# Patient Record
Sex: Male | Born: 1975 | Race: White | Hispanic: No | Marital: Married | Smoking: Never smoker
Health system: Southern US, Community
[De-identification: ages and names within clinical notes are randomized; demographics above are authoritative.]

## PROBLEM LIST (undated history)

## (undated) DIAGNOSIS — E119 Type 2 diabetes mellitus without complications: Secondary | ICD-10-CM

---

## 2012-09-12 DIAGNOSIS — F32A Depression, unspecified: Secondary | ICD-10-CM | POA: Insufficient documentation

## 2015-05-15 DIAGNOSIS — Z8709 Personal history of other diseases of the respiratory system: Secondary | ICD-10-CM | POA: Insufficient documentation

## 2018-08-28 DIAGNOSIS — N401 Enlarged prostate with lower urinary tract symptoms: Secondary | ICD-10-CM | POA: Insufficient documentation

## 2019-05-20 ENCOUNTER — Ambulatory Visit (INDEPENDENT_AMBULATORY_CARE_PROVIDER_SITE_OTHER): Payer: 59 | Admitting: Adult Health

## 2019-05-20 ENCOUNTER — Encounter: Payer: Self-pay | Admitting: Adult Health

## 2019-05-20 ENCOUNTER — Other Ambulatory Visit: Payer: Self-pay

## 2019-05-20 VITALS — BP 149/101 | HR 111 | Ht 71.0 in | Wt 278.0 lb

## 2019-05-20 DIAGNOSIS — F331 Major depressive disorder, recurrent, moderate: Secondary | ICD-10-CM

## 2019-05-20 DIAGNOSIS — G4700 Insomnia, unspecified: Secondary | ICD-10-CM

## 2019-05-20 DIAGNOSIS — F411 Generalized anxiety disorder: Secondary | ICD-10-CM | POA: Diagnosis not present

## 2019-05-20 MED ORDER — FLUOXETINE HCL 20 MG PO CAPS: 20.0000 mg | ORAL_CAPSULE | Freq: Every day | ORAL | 5 refills | Status: DC

## 2019-05-20 MED ORDER — HYDROXYZINE HCL 25 MG PO TABS: ORAL_TABLET | ORAL | 5 refills | Status: DC
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
## 2019-05-24 ENCOUNTER — Ambulatory Visit: Payer: 59 | Admitting: Adult Health

## 2019-05-29 ENCOUNTER — Ambulatory Visit: Payer: Managed Care, Other (non HMO)

## 2019-06-17 ENCOUNTER — Ambulatory Visit: Payer: 59 | Admitting: Adult Health

## 2019-06-24 ENCOUNTER — Ambulatory Visit: Payer: 59 | Admitting: Adult Health

## 2019-11-19 ENCOUNTER — Other Ambulatory Visit: Payer: Self-pay | Admitting: Adult Health

## 2019-11-21 ENCOUNTER — Other Ambulatory Visit: Payer: Self-pay | Admitting: Adult Health

## 2019-11-21 NOTE — Telephone Encounter (Signed)
Last apt 02/11 due back 4 weeks

## 2020-05-13 ENCOUNTER — Emergency Department (HOSPITAL_BASED_OUTPATIENT_CLINIC_OR_DEPARTMENT_OTHER)
Admission: EM | Admit: 2020-05-13 | Discharge: 2020-05-13 | Disposition: A | Discharge status: Home / Self Care | Payer: Worker's Compensation | Attending: Emergency Medicine | Admitting: Emergency Medicine

## 2020-05-13 ENCOUNTER — Other Ambulatory Visit: Payer: Self-pay

## 2020-05-13 ENCOUNTER — Emergency Department (HOSPITAL_BASED_OUTPATIENT_CLINIC_OR_DEPARTMENT_OTHER): Payer: Worker's Compensation

## 2020-05-13 DIAGNOSIS — Y990 Civilian activity done for income or pay: Secondary | ICD-10-CM | POA: Diagnosis not present

## 2020-05-13 DIAGNOSIS — Z79899 Other long term (current) drug therapy: Secondary | ICD-10-CM | POA: Insufficient documentation

## 2020-05-13 DIAGNOSIS — J45909 Unspecified asthma, uncomplicated: Secondary | ICD-10-CM | POA: Insufficient documentation

## 2020-05-13 DIAGNOSIS — S62346A Nondisplaced fracture of base of fifth metacarpal bone, right hand, initial encounter for closed fracture: Secondary | ICD-10-CM | POA: Diagnosis not present

## 2020-05-13 DIAGNOSIS — S6991XA Unspecified injury of right wrist, hand and finger(s), initial encounter: Secondary | ICD-10-CM | POA: Diagnosis present

## 2020-05-13 NOTE — Discharge Instructions (Addendum)
Recommend following up with an orthopedic doctor Monday.  Call the office where he had been previously seen.  You should not bear weight in your right hand until you are further instructed by your orthopedist.  Maintain splint.  Keep dry.  Use sling as needed for comfort.  Take Tylenol and Motrin as needed for pain control.

## 2020-05-13 NOTE — ED Notes (Signed)
Pt. Did not want the Sling.
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
## 2020-05-13 NOTE — ED Triage Notes (Signed)
Pt stated That he hurt his wrist in the performance of his job.

## 2020-05-24 ENCOUNTER — Other Ambulatory Visit: Payer: Self-pay | Admitting: Adult Health

## 2021-02-08 ENCOUNTER — Other Ambulatory Visit: Payer: Self-pay | Admitting: Adult Health

## 2021-02-09 NOTE — Telephone Encounter (Signed)
Patient was seen once by you 05/20/19 with RTC in 4 weeks. All scheduled appt since then have been cancelled or no show. ? Refuse request?
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
## 2024-02-23 NOTE — Progress Notes (Signed)
 Patient presents for  Chief Complaint  Patient presents with  . Annual Exam   HPI   Patient Care Team: Eva Sharolyn Sniff, DO as PCP - General (Family Medicine) Eva Sharolyn Sniff, DO as PCP - Attributed  Immunizations:  Immunizations by Immunization Family     Influenza, Injectable, Quadrivalent, Preservative Free 03/27/2018 (48 y.o.)      TDAP VACCINE (BOOSTRIX,ADACEL) 7Y+ 07/25/2018 (48 y.o.)          Health Maintenance Status       Date Due Completion Dates   Comprehensive Annual Visit Never done ---   Diabetes: Retinopathy Screening Combo Never done ---   Diabetes: Foot Exam Never done ---   Colorectal Cancer Screening Never done ---   Pneumococcal Vaccine: Pediatrics (0 to 5 years) and At-Risk Patients (6-49 Years) (1 of 2 - PCV) 02/22/2025 (Originally 09/16/1994) ---   Depression Monitoring 08/22/2024 02/23/2024   Diabetes:  eGFR for Kidney Evaluation 02/05/2025 02/06/2024, 11/04/2023   Diabetes:  Quantitative uACR for Kidney Evaluation 02/05/2025 02/06/2024   Diabetes: Hemoglobin A1C 02/05/2025 02/06/2024, 02/06/2024   DTaP/Tdap/Td Vaccines (2 - Td or Tdap) 07/24/2028 07/25/2018   Adult RSV (50+ Years or Pregnancy) (1 - 1-dose 75+ series) 09/16/2049 ---       Last PSA: N/A  New problems/Concerns:  ---reports his blood sugars have been up/down. He reports on his Dexcom his sugar dropped the other night to 56.  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   Reflux Patient presents for GERD follow up.  Reflux symptoms are well controlled with OTC Prilosec. Denies abdominal pain, dysphagia, regurgitation.    Diabetes Presents in follow up of his diabetes.  Patient is currently taking Metformin 1,000 mg BID, Mounjaro 5 mg weekly.  Hypoglycemic episodes: No Patient has no new complaints of foot numbness or burning or loss of sensation. Pt reports no lesions on the feet currently.  Denies polyuria, polydipsia, polyphagia, changes in vision, N/T. Last  diabetic eye exam (2 years ago) Lab Results  Component Value Date   HGBA1C 5.7 (H) 02/06/2024   BPH Pt reports he doesn't have any further symptoms. This had resolved over 2 years ago.   Anxiety/Depression Treated without medication.  Pt reports his stress was bad back in 2013 when going through his divorce.  He is doing well and feels this has resolved.   OSA Moderate, gets good results with wearing cpap nightly.   Insomnia Sleeping well. Wearing the cpap nightly.   Morbid Obesity:  --Patient has been following healthy diet.  Wt Readings from Last 3 Encounters:  02/23/24 133 kg (294 lb)  12/29/23 (!) 138 kg (304 lb 9.6 oz)  10/16/23 (!) 143 kg (316 lb)     Allergies[1]  Current Medications[2]  The following portions of the patient's history were reviewed and updated as appropriate: allergies, current medications, past medical history, past surgical history, past social history, past family history, and problem list.    REVIEW OF SYSTEMS   CONSTITUTIONAL: Appetite good, no fevers, night sweats or weight loss. HEAD: No unusual headaches. EYES: No visual changes, no eye pain. ENT: No hearing difficulties, no ear pain. CV: No chest pain or shortness of breath. RESPIRATORY: No cough or wheezing. GI: No difficulty swallowing,change in bowel habits, blood in the stool or black tarry . GU: No dysuria, urgency or incontinence or change in urinary habits. MS: No joint pain/swelling or musculoskeletal deformities. SKIN: No rashes. NEURO: No MS changes, no motor weakness, no  sensory changes. ENDOCRINE: No polyuria/polydipsia, no heat intolerance. HEME/LYMPH: No easy bleeding/bruising or swollen nodes.  PHYSICAL EXAM  BP 116/76 (BP Location: Right arm, Patient Position: Sitting)   Pulse 91   Resp 16   Ht 1.803 m (5' 11)   Wt 133 kg (294 lb)   BMI 41.00 kg/m  Wt Readings from Last 3 Encounters:  02/23/24 133 kg (294 lb)  12/29/23 (!) 138 kg (304 lb 9.6 oz)  10/16/23  (!) 143 kg (316 lb)    The 10-year ASCVD risk score (Arnett DK, et al., 2019) is: 4.3%   Values used to calculate the score:     Age: 5 years     Clinically relevant sex: Male     Is Non-Hispanic African American: No     Diabetic: Yes     Tobacco smoker: No     Systolic Blood Pressure: 116 mmHg     Is BP treated: No     HDL Cholesterol: 39 mg/dL     Total Cholesterol: 150 mg/dL  GENERAL: 48 y.o. year y/o male, well developed, well nourished in no acute distress. HEENT:  PERRLA, Extra ocular movements intact, Tympanic membranes clear bilaterally, oropharynx exam normal.  NECK:  thyroid without obvious nodules or enlargement. No lymphadenopathy.  No carotid bruits.   LUNGS: Respirations unlabored. Clear to auscultation bilaterally. HEART: Regular rate without murmurs, rubs, or gallops. ABDOMEN: Normal bowel sounds. Soft, non-tender, no hepatosplenomegaly, no palpable masses or lesions. EXTREMITIES: Peripheral pulses 2+ bilaterally, no edema. SKIN: No rashes, no evidence of skin breakdown, no suspicious nevi. NEURO: Cranial nerves II-XII intact.  Reflexes 2+ and symmetric. No obvious deficits. MENTAL STATUS: Alert, normal mental status. Answers all questions appropriately.  Diabetic Foot Exam Performed: Yes Diabetic Foot Exam Result:  [x]  Normal The patient's foot has been visually inspected with no ulcers, deformities, or abnormal toenails.  The patient can see the bottom of his/her feet and shoes fit properly. The patient can feel the 10g nylon filament on both feet for the 1st, 3rd, 5th metatarsals and big toes.  The patient's pedal pulses are palpable on both feet at dorsalis pedis and/or posterior tibial.  (If normal selected no need to complete below this line.) []  Abnormal  []  Abnormal with neuropathy []  Bilateral amputation lower extremities   Patient was offered the option to change into a gown for exam today and declined to change into gown/sheet combination for exam.  .  Results for orders placed or performed in visit on 02/06/24  Hemoglobin A1C With Estimated Average Glucose   Collection Time: 02/06/24 11:44 AM  Result Value Ref Range   Hemoglobin A1c 5.7 (H) <5.7 %   Estimated Average Glucose 117 mg/dL  Comprehensive Metabolic Panel   Collection Time: 02/06/24 11:44 AM  Result Value Ref Range   Sodium 137 136 - 145 mmol/L   Potassium 4.6 3.5 - 5.1 mmol/L   Chloride 104 98 - 107 mmol/L   CO2 23 21 - 31 mmol/L   Anion Gap 10 6 - 14 mmol/L   Glucose, Random 83 70 - 99 mg/dL   Blood Urea Nitrogen (BUN) 10 7 - 25 mg/dL   Creatinine 9.04 9.29 - 1.30 mg/dL   eGFR >09 >40 fO/fpw/8.26f7   Albumin 4.4 3.5 - 5.7 g/dL   Total Protein 7.4 6.4 - 8.9 g/dL   Bilirubin, Total 0.5 0.3 - 1.0 mg/dL   Alkaline Phosphatase (ALP) 73 34 - 104 U/L   Aspartate Aminotransferase (AST) 22 13 - 39  U/L   Alanine Aminotransferase (ALT) 35 7 - 52 U/L   Calcium 9.1 8.6 - 10.3 mg/dL   BUN/Creatinine Ratio    Lipid Panel   Collection Time: 02/06/24 11:44 AM  Result Value Ref Range   Cholesterol, Total, Lipid Panel 150 <200 mg/dL   Triglycerides, Lipid Panel 123 <150 mg/dL   HDL Cholesterol - Lipid Panel 39 (L) >40 mg/dL   LDL Cholesterol, Calculated 89 <100 mg/dL   Non-HDL Cholesterol 888 <130 mg/dL  TSH   Collection Time: 02/06/24 11:44 AM  Result Value Ref Range   TSH 1.208 0.450 - 5.330 uIU/mL  Albumin, Random Urine   Collection Time: 02/06/24 11:44 AM  Result Value Ref Range   Albumin, Urine <7 Not Established mg/L   Creatinine, Urine 203 >=20 mg/dL   Albumin/Creatinine Ratio, Urine    CBC with Differential   Collection Time: 02/06/24 11:44 AM  Result Value Ref Range   WBC 10.30 4.40 - 11.00 10*3/uL   RBC 5.31 4.50 - 5.90 10*6/uL   Hemoglobin 15.3 14.0 - 17.5 g/dL   Hematocrit 54.5 58.4 - 50.4 %   Mean Corpuscular Volume (MCV) 85.5 80.0 - 96.0 fL   Mean Corpuscular Hemoglobin (MCH) 28.7 27.5 - 33.2 pg   Mean Corpuscular Hemoglobin Conc (MCHC) 33.6 33.0 -  37.0 g/dL   Red Cell Distribution Width (RDW) 13.9 12.3 - 17.0 %   Platelet Count (PLT) 291 150 - 450 10*3/uL   Mean Platelet Volume (MPV) 8.5 6.8 - 10.2 fL   Neutrophils % 59 %   Lymphocytes % 23 %   Monocytes % 10 %   Eosinophils % 7 %   Basophils % 1 %   nRBC % 0 %   Neutrophils Absolute 6.10 1.80 - 7.80 10*3/uL   Lymphocytes # 2.40 1.00 - 4.80 10*3/uL   Monocytes # 1.00 (H) 0.00 - 0.80 10*3/uL   Eosinophils # 0.80 (H) 0.00 - 0.50 10*3/uL   Basophils # 0.10 0.00 - 0.20 10*3/uL   nRBC Absolute 0.00 <=0.00 10*3/uL    ASSESSMENT/PLAN   Routine wellness issues discussed including early cancer detection issues and cardiovascular risk reduction.  Also see HPI for additional details regarding recent wellness tests..  Routine advice given regarding diet, exercise, sleep, and weight reduction, if applicable. Immunizations reviewed and discussed.  --Labs reviewed.  1. Wellness examination (Primary) Pt is UTD on most measures. Colonoscopy ordered.   2. Gastroesophageal reflux disease without esophagitis Good control with current therapy OTC Prilosec. Avoid trigger foods and eating late. Remain upright after eating, limit caffeine. Report any new or worsening symptoms.  3. Type 2 diabetes mellitus treated without insulin    (CMD) A1C 5.7 down from 6.0. He is having some low sugars at times and also doing so well with his diet and exercise I will decrease his Metformin XR to 500 mg 1 daily. I would like him to call me if he continues to have some low sugars. Continue with Mounjaro weekly. Suggest Mediterranean, low carb & starch diet. Inspect feet daily. Yearly eye exam recommended. - metFORMIN (GLUCOPHAGE-XR) 500 mg 24 hr tablet; Take 1 tablet (500 mg total) by mouth daily with breakfast.  Dispense: 90 tablet; Refill: 3  4. Moderate obstructive sleep apnea Well controlled with wearing cpap nightly. Reports he does have some issues when turning over mask will come off, but he mostly gets  good relief.   5. Morbid obesity with body mass index (BMI) of 40.0 to 44.9 in adult (CMD) He  is doing well with weight loss with taking Mounjaro weekly. Encouraged patient to continue working hard on lifestyle changes, healthy eating which includes Mediterranean diet and daily exercise as tolerated to improve overall health.   6. Insomnia, unspecified type Resolved, no issues since OSA treatment.   7. Anxiety and depression Resolved several years ago when divorce was final.   8. Benign prostatic hyperplasia, unspecified whether lower urinary tract symptoms present Resolved, pt is not had any issues and advised this was a mild episode at one time.   9. Colon cancer screening - Endo Colon; Future    Return in about 6 months (around 08/22/2024) for DM, fasting lab prior A1C, BMP.  This document serves as a record of services personally performed by Eva Sniff, DO.  It was created on their behalf by Barnie Velia Molt, CMA, a trained medical scribe, and Certified Medical Assistant (CMA). During the course of documenting the history, physical exam and medical decision making, I was functioning as a stage manager. The creation of this record is the provider's dictation and/or activities during the visit.  Electronically signed by Barnie Velia Molt, CMA 02/23/2024 3:25 PM    I agree the documentation is accurate and complete.  Electronically signed by: Eva Sharolyn Sniff, DO 02/23/2024 3:04 PM         [1] Allergies Allergen Reactions  . Peanut Anaphylaxis  . Clindamycin Itching and Rash  [2]  Current Outpatient Medications:  .  clobetasoL (TEMOVATE) 0.05 % ointment, APPLY TOPICALLY TO THE AFFECTED AREA TWICE DAILY, Disp: 60 g, Rfl: 3 .  Nystop 100,000 unit/gram powder, APPLY THREE TIMES DAILY, Disp: 60 g, Rfl: 3 .  albuterol HFA (PROVENTIL HFA;VENTOLIN HFA;PROAIR HFA) 90 mcg/actuation inhaler, Inhale 2 puffs every 4 (four) hours as needed for wheezing., Disp: 6.7 g, Rfl:  5 .  blood-glucose meter,receiver,continuous (Dexcom G7 Receiver), 1 Units by miscellaneous route every 30 (thirty) days., Disp: 1 each, Rfl: 5 .  blood-glucose sensor (Dexcom G7 Sensor), 1 each by miscellaneous route every 10 days., Disp: 3 each, Rfl: 11 .  clobetasoL (TEMOVATE) 0.05 % scalp solution, Apply  topically Once Daily., Disp: 50 mL, Rfl: 5 .  EPINEPHrine (EPIPEN) 0.3 mg/0.3 mL injection syringe, Inject 0.3 mg into the muscle once as needed for anaphylaxis., Disp: 2 each, Rfl: 0 .  metFORMIN (GLUCOPHAGE-XR) 500 mg 24 hr tablet, Take 1 tablet (500 mg total) by mouth daily with breakfast., Disp: 90 tablet, Rfl: 3 .  Mounjaro 5 mg/0.5 mL subcutaneous pen injector, Inject 5 mg under the skin every 7 days. BRAND NAME ONLY, Disp: 2 mL, Rfl: 2 .  nebulizer accessories misc, Please supply 1 box of 50 nebulizer tubing and masks along with nebulizer machine., Disp: 1 each, Rfl: 3

## 2024-02-26 ENCOUNTER — Other Ambulatory Visit: Payer: Self-pay

## 2024-02-26 ENCOUNTER — Emergency Department (HOSPITAL_BASED_OUTPATIENT_CLINIC_OR_DEPARTMENT_OTHER)

## 2024-02-26 ENCOUNTER — Encounter (HOSPITAL_BASED_OUTPATIENT_CLINIC_OR_DEPARTMENT_OTHER): Payer: Self-pay | Admitting: Urology

## 2024-02-26 ENCOUNTER — Emergency Department (HOSPITAL_BASED_OUTPATIENT_CLINIC_OR_DEPARTMENT_OTHER)
Admission: EM | Admit: 2024-02-26 | Discharge: 2024-02-26 | Discharge status: Against Medical Advice | Attending: Emergency Medicine | Admitting: Emergency Medicine

## 2024-02-26 DIAGNOSIS — R197 Diarrhea, unspecified: Secondary | ICD-10-CM | POA: Insufficient documentation

## 2024-02-26 DIAGNOSIS — D72829 Elevated white blood cell count, unspecified: Secondary | ICD-10-CM | POA: Insufficient documentation

## 2024-02-26 DIAGNOSIS — Z91010 Allergy to peanuts: Secondary | ICD-10-CM | POA: Diagnosis not present

## 2024-02-26 DIAGNOSIS — J45909 Unspecified asthma, uncomplicated: Secondary | ICD-10-CM | POA: Diagnosis not present

## 2024-02-26 DIAGNOSIS — Z5329 Procedure and treatment not carried out because of patient's decision for other reasons: Secondary | ICD-10-CM | POA: Insufficient documentation

## 2024-02-26 DIAGNOSIS — Z7984 Long term (current) use of oral hypoglycemic drugs: Secondary | ICD-10-CM | POA: Diagnosis not present

## 2024-02-26 DIAGNOSIS — E11649 Type 2 diabetes mellitus with hypoglycemia without coma: Secondary | ICD-10-CM | POA: Diagnosis not present

## 2024-02-26 DIAGNOSIS — R112 Nausea with vomiting, unspecified: Secondary | ICD-10-CM | POA: Diagnosis present

## 2024-02-26 DIAGNOSIS — R1013 Epigastric pain: Secondary | ICD-10-CM | POA: Diagnosis not present

## 2024-02-26 DIAGNOSIS — R000 Tachycardia, unspecified: Secondary | ICD-10-CM

## 2024-02-26 DIAGNOSIS — Z7951 Long term (current) use of inhaled steroids: Secondary | ICD-10-CM | POA: Insufficient documentation

## 2024-02-26 DIAGNOSIS — Z794 Long term (current) use of insulin: Secondary | ICD-10-CM | POA: Insufficient documentation

## 2024-02-26 DIAGNOSIS — R6510 Systemic inflammatory response syndrome (SIRS) of non-infectious origin without acute organ dysfunction: Secondary | ICD-10-CM

## 2024-02-26 DIAGNOSIS — K219 Gastro-esophageal reflux disease without esophagitis: Secondary | ICD-10-CM | POA: Insufficient documentation

## 2024-02-26 DIAGNOSIS — R109 Unspecified abdominal pain: Secondary | ICD-10-CM | POA: Diagnosis present

## 2024-02-26 HISTORY — DX: Type 2 diabetes mellitus without complications: E11.9

## 2024-02-26 LAB — COMPREHENSIVE METABOLIC PANEL WITH GFR
ALT: 38 U/L (ref 0–44)
AST: 25 U/L (ref 15–41)
Albumin: 4.6 g/dL (ref 3.5–5.0)
Alkaline Phosphatase: 78 U/L (ref 38–126)
Anion gap: 15 (ref 5–15)
BUN: 13 mg/dL (ref 6–20)
CO2: 24 mmol/L (ref 22–32)
Calcium: 9.9 mg/dL (ref 8.9–10.3)
Chloride: 101 mmol/L (ref 98–111)
Creatinine, Ser: 1.13 mg/dL (ref 0.61–1.24)
GFR, Estimated: >60 mL/min (ref >60)
Glucose, Bld: 125 mg/dL — ABNORMAL HIGH (ref 70–99)
Potassium: 4 mmol/L (ref 3.5–5.1)
Sodium: 139 mmol/L (ref 135–145)
Total Bilirubin: 0.6 mg/dL (ref 0.0–1.2)
Total Protein: 8.4 g/dL — ABNORMAL HIGH (ref 6.5–8.1)

## 2024-02-26 LAB — CBC
HCT: 50.8 % (ref 39.0–52.0)
Hemoglobin: 16.7 g/dL (ref 13.0–17.0)
MCH: 28.3 pg (ref 26.0–34.0)
MCHC: 32.9 g/dL (ref 30.0–36.0)
MCV: 86.1 fL (ref 80.0–100.0)
Platelets: 352 K/uL (ref 150–400)
RBC: 5.9 MIL/uL — ABNORMAL HIGH (ref 4.22–5.81)
RDW: 13.3 % (ref 11.5–15.5)
WBC: 17.2 K/uL — ABNORMAL HIGH (ref 4.0–10.5)
nRBC: 0 % (ref 0.0–0.2)

## 2024-02-26 LAB — URINALYSIS, ROUTINE W REFLEX MICROSCOPIC
Glucose, UA: NEGATIVE mg/dL
Hgb urine dipstick: NEGATIVE
Ketones, ur: 15 mg/dL — AB
Leukocytes,Ua: NEGATIVE
Nitrite: NEGATIVE
Protein, ur: 30 mg/dL — AB
Specific Gravity, Urine: 1.02 (ref 1.005–1.030)
pH: 5.5 (ref 5.0–8.0)

## 2024-02-26 LAB — LACTIC ACID, PLASMA
Lactic Acid, Venous: 2.2 mmol/L (ref 0.5–1.9)
Lactic Acid, Venous: 1.7 mmol/L (ref 0.5–1.9)

## 2024-02-26 LAB — CBG MONITORING, ED: Glucose-Capillary: 155 mg/dL — ABNORMAL HIGH (ref 70–99)

## 2024-02-26 LAB — TSH: TSH: 1.8 u[IU]/mL (ref 0.350–4.500)

## 2024-02-26 LAB — URINALYSIS, MICROSCOPIC (REFLEX)

## 2024-02-26 LAB — T4, FREE: Free T4: 0.97 ng/dL (ref 0.61–1.12)

## 2024-02-26 LAB — MAGNESIUM: Magnesium: 1.8 mg/dL (ref 1.7–2.4)

## 2024-02-26 LAB — LIPASE, BLOOD: Lipase: 23 U/L (ref 11–51)

## 2024-02-26 LAB — TROPONIN T, HIGH SENSITIVITY
Troponin T High Sensitivity: <15 ng/L (ref 0–19)
Troponin T High Sensitivity: <15 ng/L (ref 0–19)

## 2024-02-26 MED ORDER — PANTOPRAZOLE SODIUM 40 MG IV SOLR
40.0000 mg | Freq: Once | INTRAVENOUS | Status: AC
  Administered 2024-02-26: 40 mg via INTRAVENOUS
  Filled 2024-02-26: qty 10

## 2024-02-26 MED ORDER — LACTATED RINGERS IV BOLUS
1000.0000 mL | Freq: Once | INTRAVENOUS | Status: AC
  Administered 2024-02-26: 1000 mL via INTRAVENOUS

## 2024-02-26 MED ORDER — MORPHINE SULFATE (PF) 4 MG/ML IV SOLN
4.0000 mg | Freq: Once | INTRAVENOUS | Status: AC
  Administered 2024-02-26: 4 mg via INTRAVENOUS
  Filled 2024-02-26: qty 1

## 2024-02-26 MED ORDER — ONDANSETRON HCL 4 MG/2ML IJ SOLN
4.0000 mg | Freq: Once | INTRAMUSCULAR | Status: AC
  Administered 2024-02-26: 4 mg via INTRAVENOUS
  Filled 2024-02-26: qty 2

## 2024-02-26 MED ORDER — IOHEXOL 350 MG/ML SOLN
100.0000 mL | Freq: Once | INTRAVENOUS | Status: AC | PRN
  Administered 2024-02-26: 100 mL via INTRAVENOUS

## 2024-02-26 MED ORDER — DICYCLOMINE HCL 10 MG/ML IM SOLN
20.0000 mg | Freq: Once | INTRAMUSCULAR | Status: AC
  Administered 2024-02-26: 20 mg via INTRAMUSCULAR
  Filled 2024-02-26: qty 2

## 2024-02-26 MED ORDER — HYDROMORPHONE HCL 1 MG/ML IJ SOLN
1.0000 mg | Freq: Once | INTRAMUSCULAR | Status: AC
  Administered 2024-02-26: 1 mg via INTRAVENOUS
  Filled 2024-02-26: qty 1

## 2024-02-26 MED ORDER — LACTATED RINGERS IV BOLUS
250.0000 mL | Freq: Once | INTRAVENOUS | Status: AC
  Administered 2024-02-26: 250 mL via INTRAVENOUS

## 2024-02-26 MED ORDER — PIPERACILLIN-TAZOBACTAM 3.375 G IVPB: 3.3750 g | Freq: Three times a day (TID) | INTRAVENOUS | Status: DC

## 2024-02-26 MED ORDER — HALOPERIDOL LACTATE 5 MG/ML IJ SOLN
4.0000 mg | Freq: Once | INTRAMUSCULAR | Status: AC
  Administered 2024-02-26: 4 mg via INTRAVENOUS
  Filled 2024-02-26: qty 1

## 2024-02-26 MED ORDER — ONDANSETRON 4 MG PO TBDP: 4.0000 mg | ORAL_TABLET | Freq: Three times a day (TID) | ORAL | 0 refills | Status: AC | PRN

## 2024-02-26 MED ORDER — PIPERACILLIN-TAZOBACTAM 3.375 G IVPB 30 MIN
3.3750 g | Freq: Once | INTRAVENOUS | Status: AC
  Administered 2024-02-26: 3.375 g via INTRAVENOUS
  Filled 2024-02-26: qty 50

## 2024-02-26 MED ORDER — PROMETHAZINE HCL 25 MG/ML IJ SOLN
INTRAMUSCULAR | Status: DC
Start: 2024-02-26 — End: 2024-02-26
  Filled 2024-02-26: qty 1

## 2024-02-26 MED ORDER — SODIUM CHLORIDE 0.9 % IV SOLN
12.5000 mg | Freq: Once | INTRAVENOUS | Status: DC
  Filled 2024-02-26: qty 0.5

## 2024-02-26 MED ORDER — PROMETHAZINE HCL 25 MG/ML IJ SOLN
INTRAMUSCULAR | Status: AC
  Administered 2024-02-26: 12.5 mg
  Filled 2024-02-26: qty 1

## 2024-02-26 MED ORDER — IOHEXOL 300 MG/ML  SOLN: 100.0000 mL | Freq: Once | INTRAMUSCULAR | Status: DC | PRN

## 2024-02-26 NOTE — Discharge Instructions (Addendum)
 It was a pleasure caring for you today in the emergency department. You are leaving against medical advice as we recommend admission to the hospital for continued care  As we discussed you are welcome to come back to the ED if your symptoms worsen and you can utilize the Zofran as needed for nausea management.  Please return to the emergency department for any worsening or worrisome symptoms.

## 2024-02-26 NOTE — ED Provider Notes (Addendum)
 [place] [facility] Provider Note  CSN: [id number] Arrival date & time: 02/26/24 9273  Chief Complaint(s) Emesis and Hypoglycemia  HPI Bruce Lewis is a 48 y.o. male with past medical history as below, significant for T2DM, MDD, OSA, BPH, GERD who presents to the ED with complaint of low blood sugar, vomiting and diarrhea.  Reports his symptoms began suddenly around 2:30 AM when he started having nonbloody vomit of around 6 episodes.  Reports he also began having diarrhea at about the same volume at that time.  Wears Dexcom states his blood sugar got down to the 70s and he felt shaky and sweaty.  Reports he tried to eat some crackers and ginger ale but vomited all up and his shaking worsened therefore he came to the ED.  Denies fever.  Denies sick contacts, new foods or recent antibiotic use.  Currently treated with Mounjaro 5 mg weekly and metformin 500 mg daily (first dose yesterday), recently decreased from metformin 1000 mg twice daily due to hypoglycemic events.  Down 25+ pounds in the past 5 months.   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 Authorizing Provider  metFORMIN (GLUCOPHAGE-XR) 500 MG 24 hr tablet Take 500 mg by mouth daily with breakfast. 02/23/24 02/22/25 Yes [provider]  albuterol (VENTOLIN HFA) 108 (90 Base) MCG/ACT inhaler Inhale into the lungs. 07/02/18   [provider]  amoxicillin (AMOXIL) 875 MG tablet Take 875 mg by mouth  2 (two) times daily. 05/19/19   [provider]  FLUoxetine  (PROZAC ) 20 MG capsule TAKE 1 CAPSULE(20 MG) BY MOUTH DAILY 02/09/21   Mozingo, Regina Nattalie, NP  hydrOXYzine  (ATARAX /VISTARIL ) 25 MG tablet TAKE 1/2 TO 1 TABLET BY MOUTH TWICE DAILY DURING THE DAY FOR ANXIETY AND 1 TO 2 TABLETS AT BEDTIME AS NEEDED FOR SLEEP 11/22/19   Mozingo, Regina Nattalie, NP  MOUNJARO 5 MG/0.5ML Pen SMARTSIG:5 Milligram(s) SUB-Q Once a Week    [provider]  omeprazole (PRILOSEC) 40 MG capsule TAKE ONE CAPSULE BY MOUTH TWICE DAILY 10/15/16   [provider]  predniSONE (DELTASONE) 10 MG tablet  05/19/19   [provider]  triamcinolone cream (KENALOG) 0.1 % Apply topically to rash, twice daily as needed 12/03/18   [provider]  Past Surgical History History reviewed. No pertinent surgical history. Family History History reviewed. No pertinent family history.  Social History Social History   Tobacco Use   Smoking status: Never   Smokeless tobacco: Never  Substance Use Topics   Alcohol use: Never   Drug use: Never   Allergies Peanut-containing drug products and Clindamycin/lincomycin  Review of Systems A thorough review of systems was obtained and all systems are negative except as noted in the HPI and PMH.   Physical Exam Vital Signs  I have reviewed the triage vital signs BP 109/78   Pulse (!) 123   Temp 98.5 F (36.9 C) (Oral)   Resp 18   Ht 6' (1.829 m)   Wt 122.5 kg   SpO2 94%   BMI 36.63 kg/m  Physical Exam General: Well-appearing. Alert. NAD HEENT: Normocephalic. White sclera. No rhinorrhea or congestion CV: Tachycardia.  Regular rhythm.  No murmur. Pulm: CTAB. Normal WOB on RA. No wheezing Abdomen: Soft, non-distended. +BS.  Mild tenderness to palpation in RUQ.  No rebound tenderness or guarding. Ext: Well  perfused. Cap refill < 3 seconds Skin: Warm, dry. No rashes noted   ED Results and Treatments Labs (all labs ordered are listed, but only abnormal results are displayed) Labs Reviewed  COMPREHENSIVE METABOLIC PANEL WITH GFR - Abnormal; Notable for the following components:      Result Value   Glucose, Bld 125 (*)    Total Protein 8.4 (*)    All other components within normal limits  CBC - Abnormal; Notable for the following components:   WBC 17.2 (*)    RBC 5.90 (*)    All other components within normal limits  URINALYSIS, ROUTINE W REFLEX MICROSCOPIC - Abnormal; Notable for the following components:   Bilirubin Urine SMALL (*)    Ketones, ur 15 (*)    Protein, ur 30 (*)    All other components within normal limits  LACTIC ACID, PLASMA - Abnormal; Notable for the following components:   Lactic Acid, Venous 2.2 (*)    All other components within normal limits  URINALYSIS, MICROSCOPIC (REFLEX) - Abnormal; Notable for the following components:   Bacteria, UA RARE (*)    All other components within normal limits  CBG MONITORING, ED - Abnormal; Notable for the following components:   Glucose-Capillary 155 (*)    All other components within normal limits  CULTURE, BLOOD (ROUTINE X 2)  CULTURE, BLOOD (ROUTINE X 2)  LIPASE, BLOOD  LACTIC ACID, PLASMA  MAGNESIUM  TSH  T4, FREE  TROPONIN T, HIGH SENSITIVITY  TROPONIN T, HIGH SENSITIVITY  TROPONIN T, HIGH SENSITIVITY  TROPONIN T, HIGH SENSITIVITY                                                                                                                          Radiology CT ABDOMEN PELVIS W CONTRAST Result Date: 02/26/2024 EXAM: CTA CHEST AND CT ABDOMEN AND PELVIS WITH CONTRAST 02/26/2024 10:18:33 AM TECHNIQUE: CTA  of the chest and CT of the abdomen and pelvis were performed without and with the administration of 100 mL of intravenous iohexol  (OMNIPAQUE ) 350 MG/ML injection. Multiplanar reformatted images are provided for  review. MIP images are provided for review. Automated exposure control, iterative reconstruction, and/or weight based adjustment of the mA/kV was utilized to reduce the radiation dose to as low as reasonably achievable. COMPARISON: None available. CLINICAL HISTORY: Pulmonary embolism (PE) suspected, high prob. Abdominal pain with nausea vomiting and diarrhea since this morning. FINDINGS: AORTA: Thoracic aorta is normal in caliber. No thoracic aortic dissection. No aneurysm. MEDIASTINUM: Heart is normal in size. No significant calcified plaque of the coronary arteries. Pulmonary arterial system is well opacified without evidence of emboli. Remaining vessel structures are unremarkable. Remaining mediastinal structures are unremarkable. LYMPH NODES: No mediastinal, hilar or axillary lymphadenopathy. LUNGS AND PLEURA: Lungs are adequately inflated without acute airspace consolidation or effusion. 1 cm nodule over the posteromedial left lower lobe (image 64). This was obscured by a focal airspace process on the prior exam from November 2023. Stable 3 mm nodule over the posterior medial right upper lobe. Stable 4 mm nodule over the right lower lobe adjacent the diaphragm unchanged. These two smaller nodules are unchanged for two years and therefore considered benign, as no further imaging follow-up is recommended. No pleural effusion or pneumothorax. UPPER ABDOMEN: Abdominal images demonstrate no focal liver abnormality. Gallbladder and biliary tree are normal. Mild diffuse low attenuation of the liver compatible with degree of steatosis. Kidneys are normal in size. Multiple bilateral renal cysts unchanged as no further follow-up imaging is recommended . Appendix is not visualized. Remaining bones and soft tissues of the abdomen and pelvis are intact. SOFT TISSUES AND BONES: Bony structures of the thorax are unchanged without focal abnormality. Remaining bones and soft tissues of the abdomen and pelvis are intact.  IMPRESSION: 1. No evidence of pulmonary embolism. 2. Stable 1 cm solid nodule in the posteromedial left lower lobe; consider non-contrast chest CT at 3 months, PET/CT, or tissue sampling per Fleischner Society Guidelines. 3. No acute findings in the abdomen or pelvis. 4. Hepatic steatosis Electronically signed by: Toribio Agreste MD 02/26/2024 10:53 AM EST RP Workstation: [id number]   CT Angio Chest PE W and/or Wo Contrast Result Date: 02/26/2024 EXAM: CTA CHEST AND CT ABDOMEN AND PELVIS WITH CONTRAST 02/26/2024 10:18:33 AM TECHNIQUE: CTA of the chest and CT of the abdomen and pelvis were performed without and with the administration of 100 mL of intravenous iohexol  (OMNIPAQUE ) 350 MG/ML injection. Multiplanar reformatted images are provided for review. MIP images are provided for review. Automated exposure control, iterative reconstruction, and/or weight based adjustment of the mA/kV was utilized to reduce the radiation dose to as low as reasonably achievable. COMPARISON: None available. CLINICAL HISTORY: Pulmonary embolism (PE) suspected, high prob. Abdominal pain with nausea vomiting and diarrhea since this morning. FINDINGS: AORTA: Thoracic aorta is normal in caliber. No thoracic aortic dissection. No aneurysm. MEDIASTINUM: Heart is normal in size. No significant calcified plaque of the coronary arteries. Pulmonary arterial system is well opacified without evidence of emboli. Remaining vessel structures are unremarkable. Remaining mediastinal structures are unremarkable. LYMPH NODES: No mediastinal, hilar or axillary lymphadenopathy. LUNGS AND PLEURA: Lungs are adequately inflated without acute airspace consolidation or effusion. 1 cm nodule over the posteromedial left lower lobe (image 64). This was obscured by a focal airspace process on the prior exam from November 2023. Stable 3 mm nodule over the posterior medial right upper lobe. Stable 4  mm nodule over the right lower lobe adjacent the diaphragm  unchanged. These two smaller nodules are unchanged for two years and therefore considered benign, as no further imaging follow-up is recommended. No pleural effusion or pneumothorax. UPPER ABDOMEN: Abdominal images demonstrate no focal liver abnormality. Gallbladder and biliary tree are normal. Mild diffuse low attenuation of the liver compatible with degree of steatosis. Kidneys are normal in size. Multiple bilateral renal cysts unchanged as no further follow-up imaging is recommended . Appendix is not visualized. Remaining bones and soft tissues of the abdomen and pelvis are intact. SOFT TISSUES AND BONES: Bony structures of the thorax are unchanged without focal abnormality. Remaining bones and soft tissues of the abdomen and pelvis are intact. IMPRESSION: 1. No evidence of pulmonary embolism. 2. Stable 1 cm solid nodule in the posteromedial left lower lobe; consider non-contrast chest CT at 3 months, PET/CT, or tissue sampling per Fleischner Society Guidelines. 3. No acute findings in the abdomen or pelvis. 4. Hepatic steatosis Electronically signed by: Toribio Agreste MD 02/26/2024 10:53 AM EST RP Workstation: [id number]   US  Abdomen Limited RUQ (LIVER/GB) Result Date: 02/26/2024 EXAM: Right Upper Quadrant Abdominal Ultrasound 02/26/2024 09:04:22 AM TECHNIQUE: Real-time ultrasonography of the right upper quadrant of the abdomen was performed. COMPARISON: CT abdomen and pelvis 05/10/2018. CLINICAL HISTORY: 48 year old male with right upper quadrant abdominal pain, nausea, and vomiting. FINDINGS: LIVER: Echogenic liver compatible with hepatic steatosis, which was present in 2020. Common bile duct could not be identified due to dense liver parenchyma which is difficult to penetrate. No evidence of intrahepatic biliary ductal dilatation. No evidence of mass. BILIARY SYSTEM: Gallbladder wall thickness measures 2.3 mm. No pericholecystic fluid. No cholelithiasis. Common bile duct could not be identified due to dense  liver parenchyma which is difficult to penetrate. VASCULATURE: Main portal vein also difficult to evaluate with doppler, although appears to remain patent on spectral doppler image 42. however, there is a relative lack of color doppler signal. Portal vein patency not confirmed. OTHER: No right upper quadrant ascites. Negative for visible right kidney. IMPRESSION: 1. Portal vein patency not confirmed due to limited Doppler evaluation. CT abdomen and pelvis with IV contrast (routine protocol exam) would best evaluate further. 2. Chronic hepatic steatosis. 3. Negative gallbladder. Electronically signed by: Helayne Hurst MD 02/26/2024 09:37 AM EST RP Workstation: [id number]    Pertinent labs & imaging results that were available during my care of the patient were reviewed by me and considered in my medical decision making (see MDM for details).  Medications Ordered in ED Medications  promethazine  (PHENERGAN ) 12.5 mg in sodium chloride  0.9 % 50 mL IVPB (12.5 mg Intravenous Not Given 02/26/24 0927)  piperacillin -tazobactam (ZOSYN ) IVPB 3.375 g (has no administration in time range)  lactated ringers  bolus 1,000 mL (0 mLs Intravenous Stopped 02/26/24 0947)  ondansetron  (ZOFRAN ) injection 4 mg (4 mg Intravenous Given 02/26/24 0757)  ondansetron  (ZOFRAN ) injection 4 mg (4 mg Intravenous Given 02/26/24 0856)  morphine  (PF) 4 MG/ML injection 4 mg (4 mg Intravenous Given 02/26/24 0900)  lactated ringers  bolus 1,000 mL (0 mLs Intravenous Stopped 02/26/24 1136)  HYDROmorphone  (DILAUDID ) injection 1 mg (1 mg Intravenous Given 02/26/24 0921)  piperacillin -tazobactam (ZOSYN ) IVPB 3.375 g (0 g Intravenous Stopped 02/26/24 1051)  promethazine  (PHENERGAN ) 25 MG/ML injection (12.5 mg  Given 02/26/24 0924)  iohexol  (OMNIPAQUE ) 350 MG/ML injection 100 mL (100 mLs Intravenous Contrast Given 02/26/24 1000)  dicyclomine  (BENTYL ) injection 20 mg (20 mg Intramuscular Given 02/26/24 1135)  haloperidol  lactate (HALDOL ) injection  4 mg (4 mg Intravenous Given 02/26/24 1201)  lactated ringers  bolus 250 mL (0 mLs Intravenous Stopped 02/26/24 1244)  pantoprazole  (PROTONIX ) injection 40 mg (40 mg Intravenous Given 02/26/24 1223)                                                                   Medical Decision Making / ED Course  Medical Decision Making:    Kamerin Grumbine is a 48 y.o. male with past medical history as below, significant for T2DM, MDD, OSA, BPH, GERD presenting with N/V/D and concern of hypoglycemia. The complaint involves an extensive differential diagnosis and also carries with it a high risk of complications and morbidity.  Serious etiology was considered. Ddx includes but is not limited to: Gastroenteritis, cholecystitis, diverticulitis, pancreatitis  Complete initial physical exam performed, notably the patient was in no distress.    Reviewed and confirmed nursing documentation for past medical history, family history, social history.  Vital signs reviewed.    48 year old male with history as above presenting with N/V/D and concern for hypoglycemia after sudden onset of symptoms this morning.  Tachycardia likely in the setting of profound fluid loss, will treat symptomatically with fluids and antiemetics.  Having RUQ tenderness upon exam with leukocytosis to 17.2, will obtain RUQ ultrasound to further evaluate for cholecystitis picture.   Clinical Course as of 02/26/24 1318  Thu Feb 26, 2024  0915 Developed significant epigastric abdominal pain and nausea during RUQ US .  Reassessed, dry heaving and rolling around in bed due to discomfort and persistently tachycardic.  Will initiate sepsis workup given concurrent leukocytosis, obtain cultures and start IV Zosyn  for presumable intra-abdominal etiology.  RUQ US  appears unremarkable upon independent review, will obtain CTAP to elucidate further. [KH]  1000 Added CT chest given recent history of hospitalization for RLL pneumonia, possible referred etiology.  H/o  OSA, mild desat without respiratory distress after Dilaudid  administration now on supplemental oxygen. [KH]  1142 CT PE and CT AP negative for acute etiology.  Initial troponin negative.  Lactic acid mildly elevated at 2.2, received 1L bolus x 2. [KH]  1158 Continues to have significant abdominal pain and dry heaving despite antiemetics and Bentyl , will trial Haldol  for relief.  Pending second troponin and lactic acid. [KH]  1219 Continues to be persistently symptomatic despite multiple interventions.  Repeat lactate improved.  Pending second troponin, will consider admission for intractable symptoms. [KH]  1318 Second troponin negative.  Given intractable symptoms hospitalist paged for admission, discussed with Dr. Laurita. [KH]    Clinical Course User Index [KH] Theophilus Pagan, MD   Patient requesting to leave AMA, discussed at bedside and patient has capacity to understand risks/benefits of leaving including death and serious harm.  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  Advised patient can return at any time for concerns or worsening symptoms for continued evaluation and care.  Sent prescription to Zofran  to outpatient pharmacy for symptom management.   Additional history obtained: -External records from outside source obtained and reviewed including: Chart review including previous notes, labs, imaging, consultation notes including: [facility] PCP notes   Lab Tests: -I  ordered, reviewed, and interpreted labs.   The pertinent results include:   Labs Reviewed  COMPREHENSIVE METABOLIC PANEL WITH GFR - Abnormal; Notable for the following components:      Result Value   Glucose, Bld 125 (*)    Total Protein 8.4 (*)    All other components within normal limits  CBC - Abnormal; Notable for the following components:   WBC 17.2 (*)    RBC 5.90 (*)    All other  components within normal limits  URINALYSIS, ROUTINE W REFLEX MICROSCOPIC - Abnormal; Notable for the following components:   Bilirubin Urine SMALL (*)    Ketones, ur 15 (*)    Protein, ur 30 (*)    All other components within normal limits  LACTIC ACID, PLASMA - Abnormal; Notable for the following components:   Lactic Acid, Venous 2.2 (*)    All other components within normal limits  URINALYSIS, MICROSCOPIC (REFLEX) - Abnormal; Notable for the following components:   Bacteria, UA RARE (*)    All other components within normal limits  CBG MONITORING, ED - Abnormal; Notable for the following components:   Glucose-Capillary 155 (*)    All other components within normal limits  CULTURE, BLOOD (ROUTINE X 2)  CULTURE, BLOOD (ROUTINE X 2)  LIPASE, BLOOD  LACTIC ACID, PLASMA  MAGNESIUM  TSH  T4, FREE  TROPONIN T, HIGH SENSITIVITY  TROPONIN T, HIGH SENSITIVITY  TROPONIN T, HIGH SENSITIVITY  TROPONIN T, HIGH SENSITIVITY     EKG   EKG Interpretation Date/Time:  Thursday February 26 2024 07:37:12 EST Ventricular Rate:  117 PR Interval:  155 QRS Duration:  81 QT Interval:  303 QTC Calculation: 423 R Axis:   0  Text Interpretation: Sinus tachycardia Low voltage, precordial leads Borderline T abnormalities, anterior leads No previous ECGs available Confirmed by Dreama Longs ([id number]) on 02/26/2024 7:45:30 AM         Imaging Studies ordered: I ordered imaging studies including RUQ US , CTAP I independently visualized the following imaging with scope of interpretation limited to determining acute life threatening conditions related to emergency care; findings noted above I agree with the radiologist interpretation If any imaging was obtained with contrast I closely monitored patient for any possible adverse reaction a/w contrast administration in the emergency department   Medicines ordered and prescription drug management: Meds ordered this encounter  Medications   lactated  ringers  bolus 1,000 mL   ondansetron  (ZOFRAN ) injection 4 mg   ondansetron  (ZOFRAN ) injection 4 mg   morphine  (PF) 4 MG/ML injection 4 mg   lactated ringers  bolus 1,000 mL   HYDROmorphone  (DILAUDID ) injection 1 mg   promethazine  (PHENERGAN ) 12.5 mg in sodium chloride  0.9 % 50 mL IVPB   piperacillin -tazobactam (ZOSYN ) IVPB 3.375 g    Antibiotic Indication::   Intra-abdominal Infection   piperacillin -tazobactam (ZOSYN ) IVPB 3.375 g    Antibiotic Indication::   Intra-abdominal Infection   promethazine  (PHENERGAN ) 25 MG/ML injection    Rosana, Robin B: cabinet override   DISCONTD: promethazine  (PHENERGAN ) 25 MG/ML injection    Austin Gravel D: cabinet override   DISCONTD: iohexol  (OMNIPAQUE ) 300 MG/ML solution 100 mL   iohexol  (OMNIPAQUE ) 350 MG/ML injection 100 mL   dicyclomine  (BENTYL ) injection 20 mg   haloperidol  lactate (HALDOL ) injection 4 mg   lactated ringers  bolus 250 mL   pantoprazole  (PROTONIX ) injection 40 mg    -I have reviewed the patients home medicines and have made adjustments as needed   Consultations Obtained: I requested consultation  with the hospitalist,  and discussed lab and imaging findings as well as pertinent plan - they recommend: admission   Cardiac Monitoring: The patient was maintained on a cardiac monitor.  I personally viewed and interpreted the cardiac monitored which showed an underlying rhythm of: Sinus tachycardia Continuous pulse oximetry interpreted by myself, 100% on RA.   Reevaluation: After the interventions noted above, I reevaluated the patient and found that they have stayed the same  Co morbidities that complicate the patient evaluation  Past Medical History:  Diagnosis Date   Diabetes mellitus without complication (HCC)       Dispostion: Disposition decision including need for hospitalization was considered, and patient admitted to the hospital.    Final Clinical Impression(s) / ED Diagnoses Final diagnoses:  Nausea and  vomiting, unspecified vomiting type        Theophilus Pagan, MD 02/26/24 1318    Theophilus Pagan, MD 02/26/24 1319    Theophilus Pagan, MD 02/26/24 1335    Dreama Longs, MD 02/27/24 1049

## 2024-02-26 NOTE — ED Notes (Signed)
 ED Provider at bedside.

## 2024-02-26 NOTE — ED Notes (Signed)
 Called [facility] to advised Patient left AMA and no transfer or bed needed

## 2024-02-26 NOTE — ED Triage Notes (Signed)
 N/V/D since 0230, states unable to get blood sugar up due to vomiting Decreased  metformin to 500mg  yesterday.  Been on monjouro x 2 months

## 2024-02-26 NOTE — ED Notes (Signed)
 Labs were recollected for 2nd troponin due to hemolyzed specimen

## 2024-02-26 NOTE — ED Notes (Signed)
 Pt understands and signed that he is leaving Against medical advice he is aaox 4 , ambulatory out to Waiting room

## 2024-03-02 LAB — CULTURE, BLOOD (ROUTINE X 2)
Culture: NO GROWTH
Culture: NO GROWTH
Special Requests: ADEQUATE
